# Patient Record
Sex: Female | Born: 1950 | Race: White | Hispanic: No | Marital: Single | Smoking: Never smoker
Health system: Southern US, Community
[De-identification: ages and names within clinical notes are randomized; demographics above are authoritative.]

## PROBLEM LIST (undated history)

## (undated) DIAGNOSIS — R011 Cardiac murmur, unspecified: Secondary | ICD-10-CM

## (undated) DIAGNOSIS — R319 Hematuria, unspecified: Secondary | ICD-10-CM

## (undated) DIAGNOSIS — G4730 Sleep apnea, unspecified: Secondary | ICD-10-CM

## (undated) DIAGNOSIS — M810 Age-related osteoporosis without current pathological fracture: Secondary | ICD-10-CM

## (undated) DIAGNOSIS — E785 Hyperlipidemia, unspecified: Secondary | ICD-10-CM

## (undated) DIAGNOSIS — T7840XA Allergy, unspecified, initial encounter: Secondary | ICD-10-CM

## (undated) DIAGNOSIS — F329 Major depressive disorder, single episode, unspecified: Secondary | ICD-10-CM

## (undated) DIAGNOSIS — F32A Depression, unspecified: Secondary | ICD-10-CM

## (undated) HISTORY — DX: Sleep apnea, unspecified: G47.30

## (undated) HISTORY — DX: Hematuria, unspecified: R31.9

## (undated) HISTORY — DX: Allergy, unspecified, initial encounter: T78.40XA

## (undated) HISTORY — DX: Age-related osteoporosis without current pathological fracture: M81.0

## (undated) HISTORY — DX: Hyperlipidemia, unspecified: E78.5

## (undated) HISTORY — DX: Cardiac murmur, unspecified: R01.1

## (undated) HISTORY — DX: Depression, unspecified: F32.A

## (undated) HISTORY — PX: TONSILLECTOMY: SHX5217

## (undated) HISTORY — DX: Major depressive disorder, single episode, unspecified: F32.9

---

## 2012-12-18 ENCOUNTER — Emergency Department: Payer: Self-pay | Admitting: Emergency Medicine

## 2013-09-08 ENCOUNTER — Ambulatory Visit: Payer: Self-pay | Admitting: Unknown Physician Specialty

## 2013-09-12 LAB — PATHOLOGY REPORT

## 2014-08-28 IMAGING — CR DG ANKLE COMPLETE 3+V*L*
1 series · 5 of 5 positions shown · non-contrast
Comparison: none

REASON FOR EXAM: Pain and swelling after trauma
COMMENTS:   LMP: Post-Menopausal

PROCEDURE:     DXR - DXR ANKLE LEFT COMPLETE  - December 18, 2012  [DATE]
RESULT:     Comparison: None.

[Series 1: x ankle ap left · 0.14mm/px · 5 of 5 slices shown]
[im 1/5]
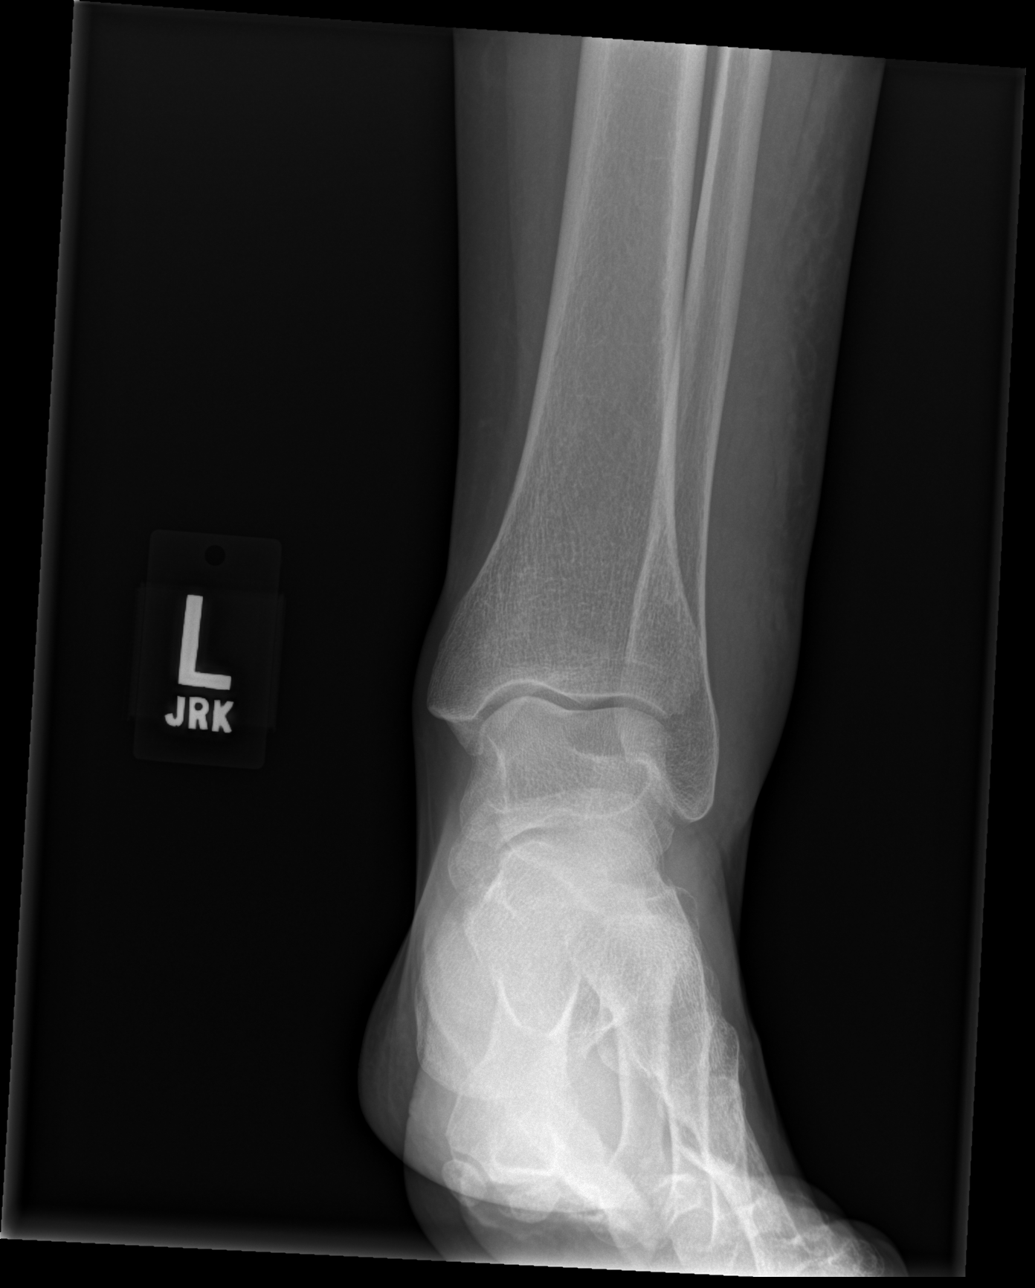
[im 2/5]
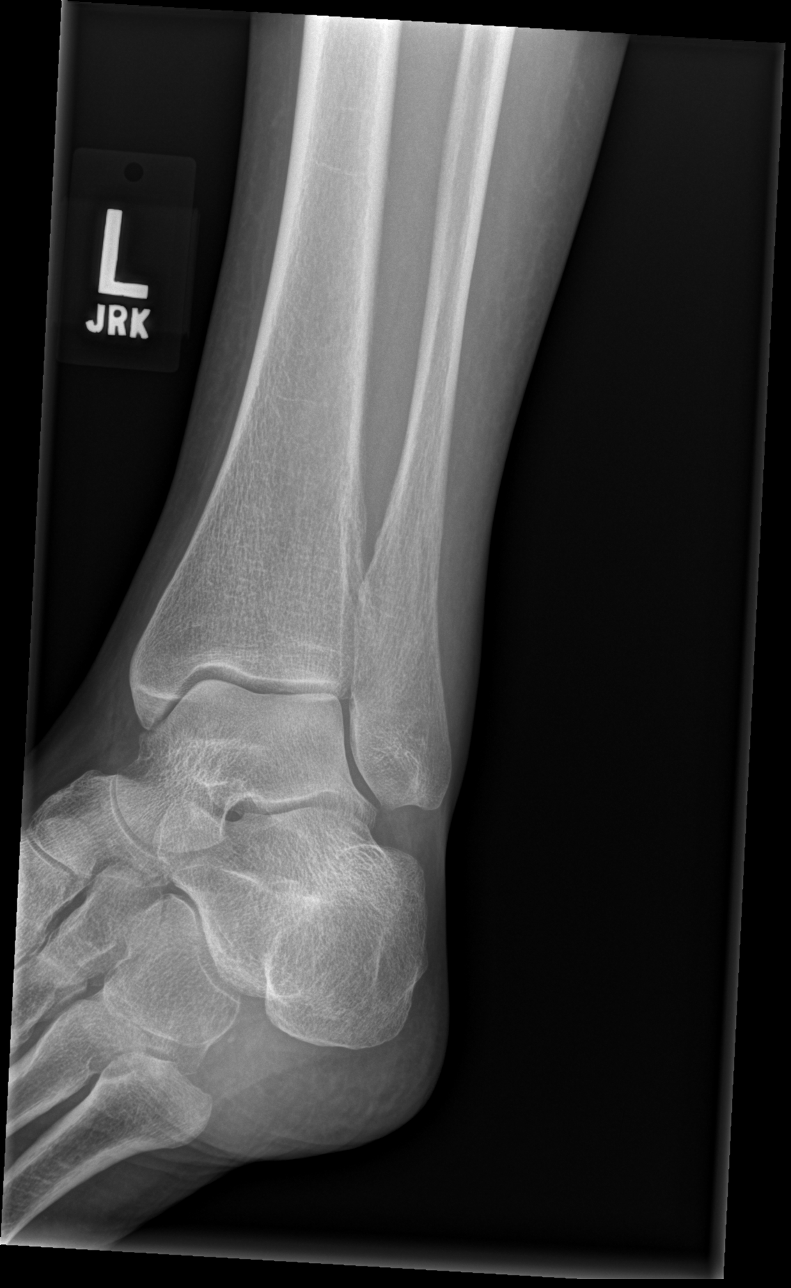
[im 3/5]
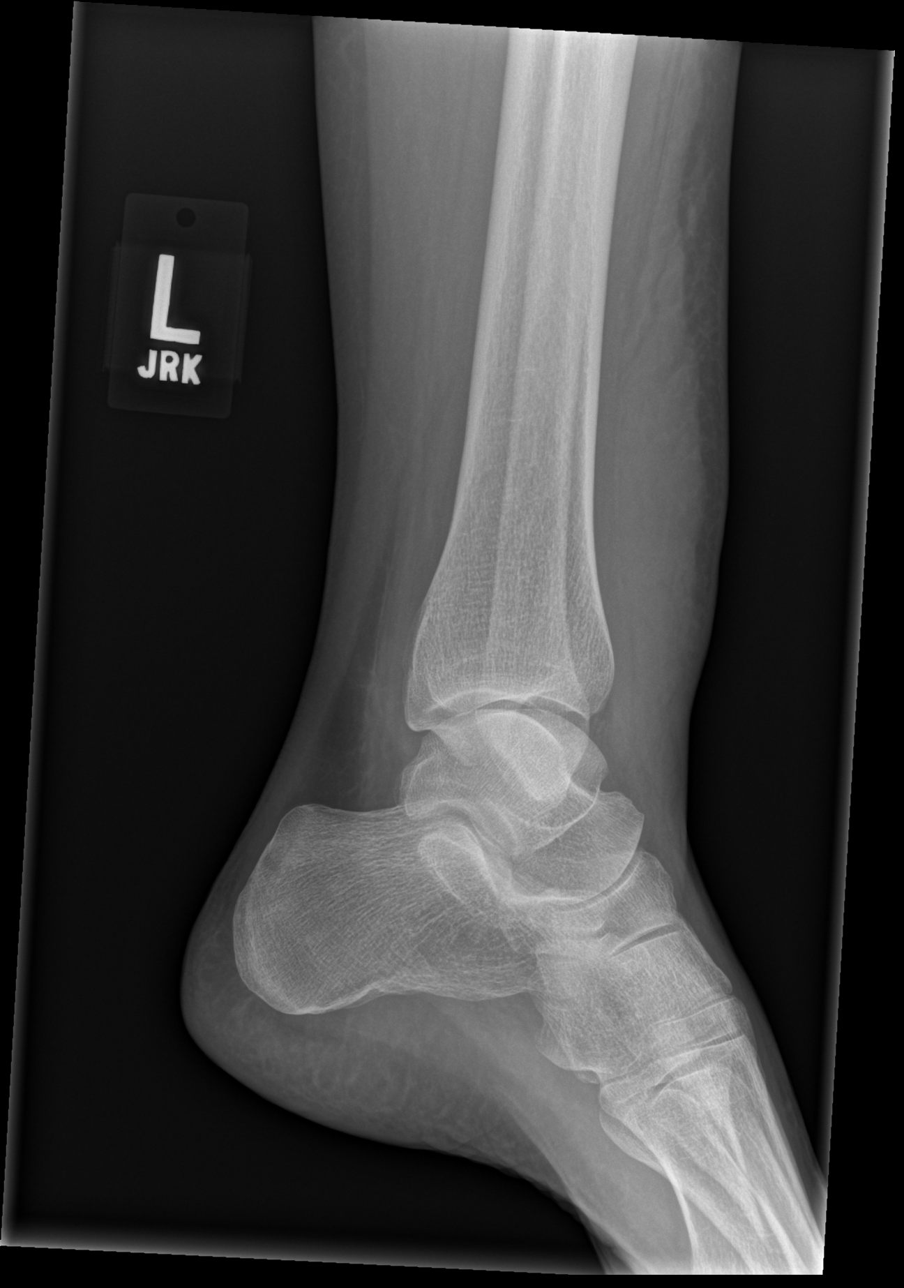
[im 4/5]
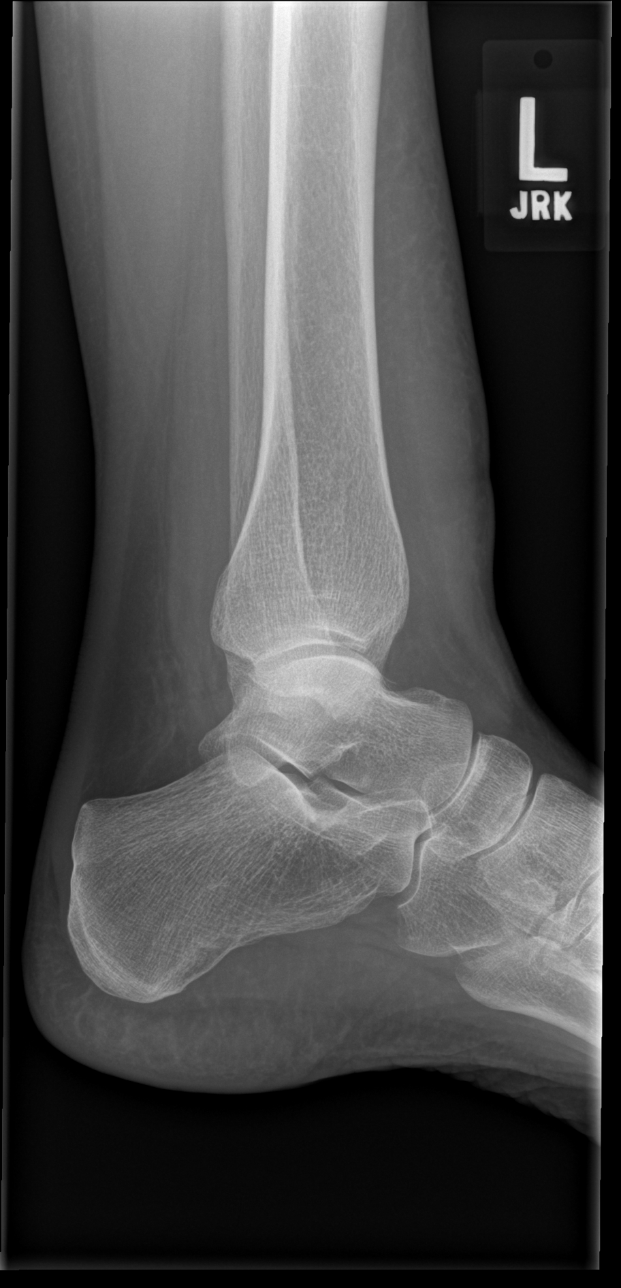
[im 5/5]
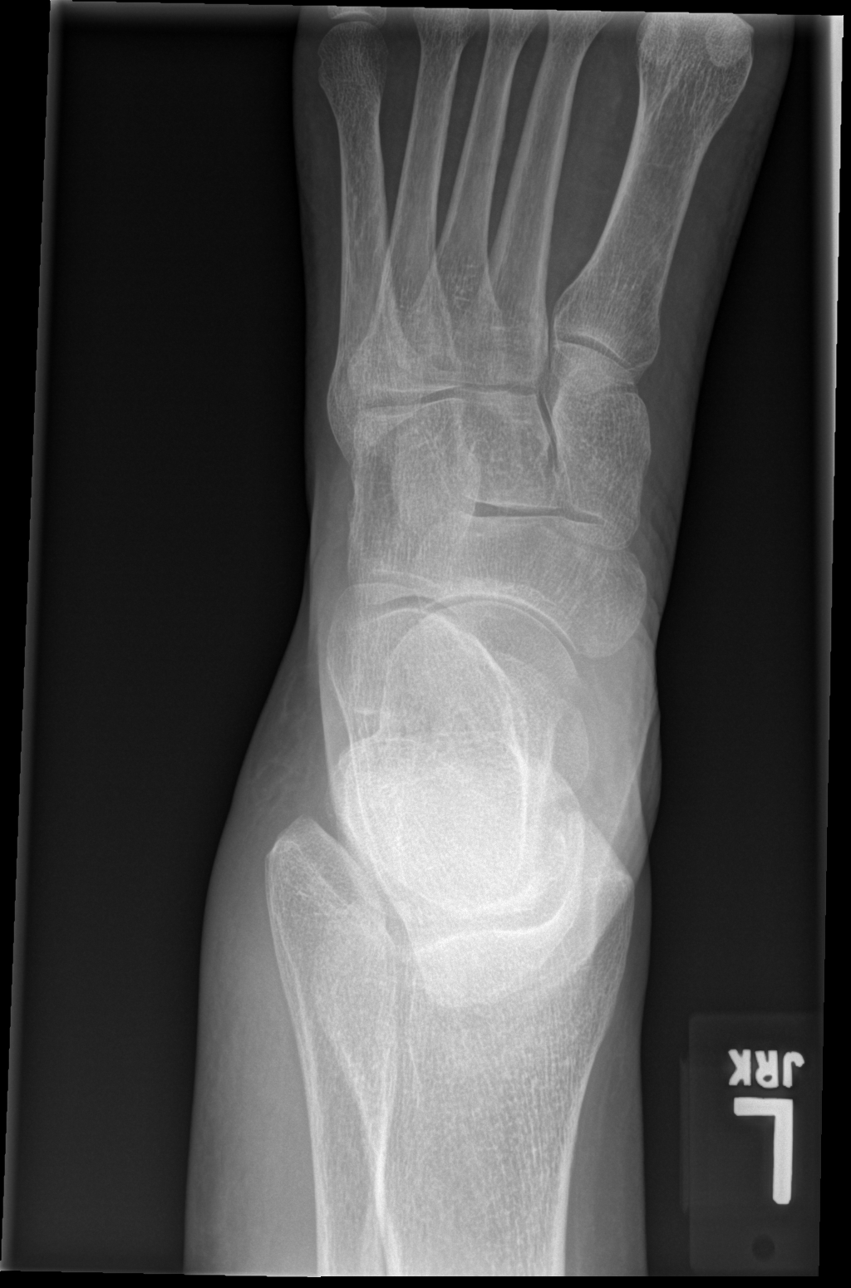

[5 of 5 positions shown; findings below may reference images not displayed]

FINDINGS: No acute fracture. Normal alignment. There is soft tissue swelling about the
lateral malleolus.
IMPRESSION: No acute fracture.

[REDACTED]

## 2015-02-01 ENCOUNTER — Emergency Department: Payer: Self-pay | Admitting: Emergency Medicine

## 2015-06-10 ENCOUNTER — Ambulatory Visit (INDEPENDENT_AMBULATORY_CARE_PROVIDER_SITE_OTHER): Payer: BC Managed Care – PPO | Admitting: Family Medicine

## 2015-06-10 ENCOUNTER — Encounter: Payer: Self-pay | Admitting: Family Medicine

## 2015-06-10 VITALS — BP 127/81 | HR 68 | Temp 98.0°F | Ht 66.8 in | Wt 125.0 lb

## 2015-06-10 DIAGNOSIS — F329 Major depressive disorder, single episode, unspecified: Secondary | ICD-10-CM | POA: Diagnosis not present

## 2015-06-10 DIAGNOSIS — E785 Hyperlipidemia, unspecified: Secondary | ICD-10-CM | POA: Diagnosis not present

## 2015-06-10 DIAGNOSIS — E7800 Pure hypercholesterolemia, unspecified: Secondary | ICD-10-CM

## 2015-06-10 DIAGNOSIS — F32A Depression, unspecified: Secondary | ICD-10-CM | POA: Insufficient documentation

## 2015-06-10 DIAGNOSIS — G4730 Sleep apnea, unspecified: Secondary | ICD-10-CM | POA: Diagnosis not present

## 2015-06-10 LAB — LP+ALT+AST PICCOLO, WAIVED
ALT (SGPT) PICCOLO, WAIVED: 26 U/L (ref 10–47)
AST (SGOT) Piccolo, Waived: 31 U/L (ref 11–38)
Chol/HDL Ratio Piccolo,Waive: 2.2 mg/dL
Cholesterol Piccolo, Waived: 147 mg/dL (ref <200)
HDL Chol Piccolo, Waived: 66 mg/dL (ref >59)
LDL CHOL CALC PICCOLO WAIVED: 65 mg/dL (ref <100)
Triglycerides Piccolo,Waived: 78 mg/dL (ref <150)
VLDL CHOL CALC PICCOLO,WAIVE: 16 mg/dL (ref <30)

## 2015-06-10 MED ORDER — ESCITALOPRAM OXALATE 10 MG PO TABS: 10.0000 mg | ORAL_TABLET | Freq: Every day | ORAL | Status: DC

## 2015-06-10 MED ORDER — ROSUVASTATIN CALCIUM 20 MG PO TABS: 20.0000 mg | ORAL_TABLET | Freq: Every day | ORAL | Status: DC

## 2015-06-10 NOTE — Progress Notes (Signed)
   BP 127/81 mmHg  Pulse 68  Temp(Src) 98 F (36.7 C)  Ht 5' 6.8" (1.697 m)  Wt 125 lb (56.7 kg)  BMI 19.69 kg/m2  SpO2 99%   Subjective:    Patient ID: Darlene Oneill, female    DOB: 09/18/1951, 64 y.o.   MRN: [id number]  HPI: Darlene Oneill is a 64 y.o. female  Chief Complaint  Patient presents with  . Hyperlipidemia  and depression stable no problems with meds takes every day No side effects Taking long term and doing well Also some muscle stiffness in back area Has apt for gyn check today  Relevant past medical, surgical, family and social history reviewed and updated as indicated. Interim medical history since our last visit reviewed. Allergies and medications reviewed and updated.  Review of Systems  Constitutional: Negative.   Respiratory: Negative.   Cardiovascular: Negative.     Per HPI unless specifically indicated above     Objective:    BP 127/81 mmHg  Pulse 68  Temp(Src) 98 F (36.7 C)  Ht 5' 6.8" (1.697 m)  Wt 125 lb (56.7 kg)  BMI 19.69 kg/m2  SpO2 99%  Wt Readings from Last 3 Encounters:  06/10/15 125 lb (56.7 kg)  12/04/14 124 lb (56.246 kg)  12/04/14 124 lb (56.246 kg)    Physical Exam  Constitutional: She is oriented to person, place, and time. She appears well-developed and well-nourished. No distress.  HENT:  Head: Normocephalic and atraumatic.  Right Ear: Hearing normal.  Left Ear: Hearing normal.  Nose: Nose normal.  Eyes: Conjunctivae and lids are normal. Right eye exhibits no discharge. Left eye exhibits no discharge. No scleral icterus.  Cardiovascular: Normal rate, regular rhythm and normal heart sounds.   Pulmonary/Chest: Effort normal and breath sounds normal. No respiratory distress.  Musculoskeletal: Normal range of motion.  Neurological: She is alert and oriented to person, place, and time.  Skin: Skin is intact. No rash noted.  Psychiatric: She has a normal mood and affect. Her speech is normal and behavior is  normal. Judgment and thought content normal. Cognition and memory are normal.        Assessment & Plan:   Problem List Items Addressed This Visit      Other   Depression    The current medical regimen is effective;  continue present plan and medications.       Relevant Medications   escitalopram (LEXAPRO) 10 MG tablet   Sleep apnea    Using CPAP and doing well      Hyperlipidemia    The current medical regimen is effective;  continue present plan and medications.       Relevant Medications   rosuvastatin (CRESTOR) 20 MG tablet    Other Visit Diagnoses    Pure hypercholesterolemia    -  Primary    Relevant Medications    rosuvastatin (CRESTOR) 20 MG tablet    Other Relevant Orders    LP+ALT+AST Piccolo, Waived        Follow up plan: Return in about 6 months (around 12/10/2015) for Physical Exam.

## 2015-06-10 NOTE — Assessment & Plan Note (Signed)
Using CPAP and doing well.  

## 2015-06-10 NOTE — Assessment & Plan Note (Signed)
The current medical regimen is effective;  continue present plan and medications.  

## 2015-08-06 ENCOUNTER — Other Ambulatory Visit: Payer: Self-pay | Admitting: Family Medicine

## 2015-08-14 ENCOUNTER — Other Ambulatory Visit: Payer: Self-pay | Admitting: Family Medicine

## 2015-12-09 ENCOUNTER — Encounter: Payer: Self-pay | Admitting: Family Medicine

## 2015-12-09 ENCOUNTER — Ambulatory Visit (INDEPENDENT_AMBULATORY_CARE_PROVIDER_SITE_OTHER): Payer: BC Managed Care – PPO | Admitting: Family Medicine

## 2015-12-09 VITALS — BP 108/73 | HR 69 | Temp 98.3°F | Ht 67.2 in | Wt 130.0 lb

## 2015-12-09 DIAGNOSIS — Z113 Encounter for screening for infections with a predominantly sexual mode of transmission: Secondary | ICD-10-CM

## 2015-12-09 DIAGNOSIS — Z23 Encounter for immunization: Secondary | ICD-10-CM | POA: Diagnosis not present

## 2015-12-09 DIAGNOSIS — F32A Depression, unspecified: Secondary | ICD-10-CM

## 2015-12-09 DIAGNOSIS — F419 Anxiety disorder, unspecified: Secondary | ICD-10-CM

## 2015-12-09 DIAGNOSIS — F329 Major depressive disorder, single episode, unspecified: Secondary | ICD-10-CM | POA: Diagnosis not present

## 2015-12-09 DIAGNOSIS — Z0000 Encounter for general adult medical examination without abnormal findings: Secondary | ICD-10-CM | POA: Diagnosis not present

## 2015-12-09 LAB — URINALYSIS, ROUTINE W REFLEX MICROSCOPIC
BILIRUBIN UA: NEGATIVE
Glucose, UA: NEGATIVE
Ketones, UA: NEGATIVE
LEUKOCYTES UA: NEGATIVE
Nitrite, UA: NEGATIVE
PH UA: 7 (ref 5.0–7.5)
PROTEIN UA: NEGATIVE
Specific Gravity, UA: 1.015 (ref 1.005–1.030)
UUROB: 0.2 mg/dL (ref 0.2–1.0)

## 2015-12-09 LAB — MICROSCOPIC EXAMINATION: WBC, UA: NONE SEEN /hpf (ref 0–?)

## 2015-12-09 MED ORDER — LORAZEPAM 0.5 MG PO TABS: 0.5000 mg | ORAL_TABLET | Freq: Four times a day (QID) | ORAL | Status: DC | PRN

## 2015-12-09 MED ORDER — ROSUVASTATIN CALCIUM 20 MG PO TABS: 20.0000 mg | ORAL_TABLET | Freq: Every day | ORAL | Status: DC

## 2015-12-09 MED ORDER — ESCITALOPRAM OXALATE 10 MG PO TABS: 10.0000 mg | ORAL_TABLET | Freq: Every day | ORAL | Status: DC

## 2015-12-09 MED ORDER — FLUTICASONE PROPIONATE 50 MCG/ACT NA SUSP: 2.0000 | Freq: Every day | NASAL | Status: AC

## 2015-12-09 NOTE — Progress Notes (Signed)
BP 108/73 mmHg  Pulse 69  Temp(Src) 98.3 F (36.8 C)  Ht 5' 7.2" (1.707 m)  Wt 130 lb (58.968 kg)  BMI 20.24 kg/m2  SpO2 99%   Subjective:    Patient ID: Darlene Oneill, female    DOB: [birth year], 64 y.o.   MRN: [id number]  HPI: Darlene Oneill is a 64 y.o. female  Chief Complaint  Patient presents with  . Annual Exam   patient with air travel coming up and has fear of flying will give some lorazepam to assist  Patient concerned about some weight gain 6 pounds over this last year and her abdomen Doing well with her medications no concerns or issues Relevant past medical, surgical, family and social history reviewed and updated as indicated. Interim medical history since our last visit reviewed. Allergies and medications reviewed and updated.  Review of Systems  Constitutional: Negative.   HENT: Negative.   Eyes: Negative.   Respiratory: Negative.   Cardiovascular: Negative.   Gastrointestinal: Negative.   Endocrine: Negative.   Genitourinary: Negative.   Musculoskeletal: Negative.   Skin: Negative.   Allergic/Immunologic: Negative.   Neurological: Negative.   Hematological: Negative.   Psychiatric/Behavioral: Negative.     Per HPI unless specifically indicated above     Objective:    BP 108/73 mmHg  Pulse 69  Temp(Src) 98.3 F (36.8 C)  Ht 5' 7.2" (1.707 m)  Wt 130 lb (58.968 kg)  BMI 20.24 kg/m2  SpO2 99%  Wt Readings from Last 3 Encounters:  12/09/15 130 lb (58.968 kg)  06/10/15 125 lb (56.7 kg)  12/04/14 124 lb (56.246 kg)    Physical Exam  Constitutional: She is oriented to person, place, and time. She appears well-developed and well-nourished.  HENT:  Head: Normocephalic and atraumatic.  Right Ear: External ear normal.  Left Ear: External ear normal.  Nose: Nose normal.  Mouth/Throat: Oropharynx is clear and moist.  Eyes: Conjunctivae and EOM are normal. Pupils are equal, round, and reactive to light.  Neck: Normal range of motion.  Neck supple. Carotid bruit is not present.  Cardiovascular: Normal rate, regular rhythm and normal heart sounds.   No murmur heard. Pulmonary/Chest: Effort normal and breath sounds normal. She exhibits no mass. Right breast exhibits no mass, no skin change and no tenderness. Left breast exhibits no mass, no skin change and no tenderness. Breasts are symmetrical.  Abdominal: Soft. Bowel sounds are normal. There is no hepatosplenomegaly.  Musculoskeletal: Normal range of motion.  Neurological: She is alert and oriented to person, place, and time.  Skin: No rash noted.  Psychiatric: She has a normal mood and affect. Her behavior is normal. Judgment and thought content normal.    Results for orders placed or performed in visit on 06/10/15  LP+ALT+AST Piccolo, [employer] ElectronicsWaived  Result Value Ref Range   ALT (SGPT) Piccolo, Waived 26 10 - 47 U/L   AST (SGOT) Piccolo, Waived 31 11 - 38 U/L   Cholesterol Piccolo, Waived 147 <200 mg/dL   HDL Chol Piccolo, Waived 66 >59 mg/dL   Triglycerides Piccolo,Waived 78 <150 mg/dL   Chol/HDL Ratio Piccolo,Waive 2.2 mg/dL   LDL Chol Calc Piccolo Waived 65 <100 mg/dL   VLDL Chol Calc Piccolo,Waive 16 <30 mg/dL      Assessment & Plan:   Problem List Items Addressed This Visit      Other   Depression   Relevant Medications   LORazepam (ATIVAN) 0.5 MG tablet   escitalopram (LEXAPRO) 10 MG tablet  Acute anxiety    Airplane travel anxiety      Relevant Medications   LORazepam (ATIVAN) 0.5 MG tablet   escitalopram (LEXAPRO) 10 MG tablet    Other Visit Diagnoses    Immunization due    -  Primary    Relevant Orders    Flu Vaccine QUAD 36+ mos PF IM (Fluarix & Fluzone Quad PF) (Completed)    Routine general medical examination at a health care facility        Relevant Orders    CBC with Differential/Platelet    Comprehensive metabolic panel    Lipid Panel w/o Chol/HDL Ratio    TSH    Urinalysis, Routine w reflex microscopic (not at [facility])    Routine  screening for STI (sexually transmitted infection)        Relevant Orders    Hepatitis C Antibody        Follow up plan: Return in about 6 months (around 06/08/2016) for med check and lipids, alt, ast.

## 2015-12-09 NOTE — Assessment & Plan Note (Signed)
Airplane travel anxiety

## 2015-12-10 ENCOUNTER — Encounter: Payer: Self-pay | Admitting: Family Medicine

## 2015-12-10 LAB — CBC WITH DIFFERENTIAL/PLATELET
BASOS ABS: 0 10*3/uL (ref 0.0–0.2)
Basos: 1 %
EOS (ABSOLUTE): 0.1 10*3/uL (ref 0.0–0.4)
EOS: 4 %
HEMOGLOBIN: 12.6 g/dL (ref 11.1–15.9)
Hematocrit: 37.6 % (ref 34.0–46.6)
IMMATURE GRANS (ABS): 0 10*3/uL (ref 0.0–0.1)
IMMATURE GRANULOCYTES: 0 %
LYMPHS: 38 %
Lymphocytes Absolute: 1.5 10*3/uL (ref 0.7–3.1)
MCH: 28 pg (ref 26.6–33.0)
MCHC: 33.5 g/dL (ref 31.5–35.7)
MCV: 84 fL (ref 79–97)
MONOCYTES: 10 %
Monocytes Absolute: 0.4 10*3/uL (ref 0.1–0.9)
Neutrophils Absolute: 1.8 10*3/uL (ref 1.4–7.0)
Neutrophils: 47 %
Platelets: 188 10*3/uL (ref 150–379)
RBC: 4.5 x10E6/uL (ref 3.77–5.28)
RDW: 13.7 % (ref 12.3–15.4)
WBC: 3.9 10*3/uL (ref 3.4–10.8)

## 2015-12-10 LAB — COMPREHENSIVE METABOLIC PANEL
ALBUMIN: 4.1 g/dL (ref 3.6–4.8)
ALK PHOS: 37 IU/L — AB (ref 39–117)
ALT: 22 IU/L (ref 0–32)
AST: 18 IU/L (ref 0–40)
Albumin/Globulin Ratio: 2 (ref 1.1–2.5)
BUN / CREAT RATIO: 26 (ref 11–26)
BUN: 22 mg/dL (ref 8–27)
Bilirubin Total: 0.4 mg/dL (ref 0.0–1.2)
CALCIUM: 9.3 mg/dL (ref 8.7–10.3)
CO2: 25 mmol/L (ref 18–29)
CREATININE: 0.85 mg/dL (ref 0.57–1.00)
Chloride: 104 mmol/L (ref 96–106)
GFR calc Af Amer: 85 mL/min/{1.73_m2} (ref >59)
GFR, EST NON AFRICAN AMERICAN: 74 mL/min/{1.73_m2} (ref >59)
GLUCOSE: 89 mg/dL (ref 65–99)
Globulin, Total: 2.1 g/dL (ref 1.5–4.5)
Potassium: 4.3 mmol/L (ref 3.5–5.2)
Sodium: 142 mmol/L (ref 134–144)
TOTAL PROTEIN: 6.2 g/dL (ref 6.0–8.5)

## 2015-12-10 LAB — TSH: TSH: 2.81 u[IU]/mL (ref 0.450–4.500)

## 2015-12-10 LAB — LIPID PANEL W/O CHOL/HDL RATIO
CHOLESTEROL TOTAL: 142 mg/dL (ref 100–199)
HDL: 67 mg/dL (ref >39)
LDL CALC: 64 mg/dL (ref 0–99)
Triglycerides: 55 mg/dL (ref 0–149)
VLDL CHOLESTEROL CAL: 11 mg/dL (ref 5–40)

## 2015-12-10 LAB — HEPATITIS C ANTIBODY

## 2015-12-11 ENCOUNTER — Other Ambulatory Visit: Payer: Self-pay | Admitting: Family Medicine

## 2016-06-08 ENCOUNTER — Encounter: Payer: Self-pay | Admitting: Family Medicine

## 2016-06-08 ENCOUNTER — Other Ambulatory Visit: Payer: Self-pay

## 2016-06-08 ENCOUNTER — Ambulatory Visit (INDEPENDENT_AMBULATORY_CARE_PROVIDER_SITE_OTHER): Payer: BC Managed Care – PPO | Admitting: Family Medicine

## 2016-06-08 VITALS — BP 131/75 | HR 71 | Temp 97.9°F | Ht 67.0 in | Wt 127.0 lb

## 2016-06-08 DIAGNOSIS — G4730 Sleep apnea, unspecified: Secondary | ICD-10-CM

## 2016-06-08 DIAGNOSIS — E785 Hyperlipidemia, unspecified: Secondary | ICD-10-CM

## 2016-06-08 DIAGNOSIS — Z124 Encounter for screening for malignant neoplasm of cervix: Secondary | ICD-10-CM

## 2016-06-08 DIAGNOSIS — F329 Major depressive disorder, single episode, unspecified: Secondary | ICD-10-CM

## 2016-06-08 DIAGNOSIS — F32A Depression, unspecified: Secondary | ICD-10-CM

## 2016-06-08 LAB — LP+ALT+AST PICCOLO, WAIVED
ALT (SGPT) PICCOLO, WAIVED: 28 U/L (ref 10–47)
AST (SGOT) Piccolo, Waived: 31 U/L (ref 11–38)
Chol/HDL Ratio Piccolo,Waive: 2.2 mg/dL
Cholesterol Piccolo, Waived: 151 mg/dL (ref <200)
HDL CHOL PICCOLO, WAIVED: 68 mg/dL (ref >59)
LDL Chol Calc Piccolo Waived: 64 mg/dL (ref <100)
TRIGLYCERIDES PICCOLO,WAIVED: 92 mg/dL (ref <150)
VLDL CHOL CALC PICCOLO,WAIVE: 18 mg/dL (ref <30)

## 2016-06-08 MED ORDER — ESCITALOPRAM OXALATE 10 MG PO TABS: 10.0000 mg | ORAL_TABLET | Freq: Every day | ORAL | Status: DC

## 2016-06-08 MED ORDER — ROSUVASTATIN CALCIUM 20 MG PO TABS: 20.0000 mg | ORAL_TABLET | Freq: Every day | ORAL | Status: DC

## 2016-06-08 NOTE — Addendum Note (Signed)
Addended by: Lurlean HornsWILSON, Kaitlynne Wenz H on: 06/08/2016 09:37 AM   Modules accepted: Orders, SmartSet

## 2016-06-08 NOTE — Assessment & Plan Note (Signed)
The current medical regimen is effective;  continue present plan and medications.  

## 2016-06-08 NOTE — Progress Notes (Signed)
BP 131/75 mmHg  Pulse 71  Temp(Src) 97.9 F (36.6 C)  Ht  (1.702 m)  Wt 127 lb (57.607 kg)  BMI 19.89 kg/m2  SpO2 98%   Subjective:    Patient ID: Darlene Oneill, female    DOB: 22-Aug-1951, 65 y.o.   MRN: [id number]  HPI: Darlene Oneill is a 65 y.o. female  Chief Complaint  Patient presents with  . Hyperlipidemia  . Depression  . Patient in need of PAP    scheduled for next week, but she is moving out of town  Patient doing well for cholesterol no issues with medications taken faithfully without problems Nerves doing well with Lexapro low-dose. Specially with stress of impending move. Patient also scheduled GYN or Pap smear next week but is moving this week so will go ahead and do Pap smear patient had physical exam 6 months ago.  Relevant past medical, surgical, family and social history reviewed and updated as indicated. Interim medical history since our last visit reviewed. Allergies and medications reviewed and updated.  Review of Systems  Constitutional: Negative.   Respiratory: Negative.   Cardiovascular: Negative.     Per HPI unless specifically indicated above     Objective:    BP 131/75 mmHg  Pulse 71  Temp(Src) 97.9 F (36.6 C)  Ht  (1.702 m)  Wt 127 lb (57.607 kg)  BMI 19.89 kg/m2  SpO2 98%  Wt Readings from Last 3 Encounters:  06/08/16 127 lb (57.607 kg)  12/09/15 130 lb (58.968 kg)  06/10/15 125 lb (56.7 kg)    Physical Exam  Constitutional: She is oriented to person, place, and time. She appears well-developed and well-nourished. No distress.  HENT:  Head: Normocephalic and atraumatic.  Right Ear: Hearing normal.  Left Ear: Hearing normal.  Nose: Nose normal.  Eyes: Conjunctivae and lids are normal. Right eye exhibits no discharge. Left eye exhibits no discharge. No scleral icterus.  Pulmonary/Chest: Effort normal. No respiratory distress.  Genitourinary: Uterus normal. Cervix exhibits no motion tenderness. Right adnexum  displays no mass, no tenderness and no fullness. Left adnexum displays no mass, no tenderness and no fullness. No erythema or tenderness in the vagina. No vaginal discharge found.  Musculoskeletal: Normal range of motion.  Neurological: She is alert and oriented to person, place, and time.  Skin: Skin is intact. No rash noted.  Psychiatric: She has a normal mood and affect. Her speech is normal and behavior is normal. Judgment and thought content normal. Cognition and memory are normal.    Results for orders placed or performed in visit on 12/09/15  Microscopic Examination  Result Value Ref Range   WBC, UA None seen 0 -  5 /hpf   RBC, UA 3-10 (A) 0 -  2 /hpf   Epithelial Cells (non renal) 0-10 0 - 10 /hpf   Mucus, UA Present Not Estab.   Bacteria, UA Few None seen/Few  Hepatitis C Antibody  Result Value Ref Range   Hep C Virus Ab <0.1 0.0 - 0.9 s/co ratio  CBC with Differential/Platelet  Result Value Ref Range   WBC 3.9 3.4 - 10.8 x10E3/uL   RBC 4.50 3.77 - 5.28 x10E6/uL   Hemoglobin 12.6 11.1 - 15.9 g/dL   Hematocrit 21.3 08.6 - 46.6 %   MCV 84 79 - 97 fL   MCH 28.0 26.6 - 33.0 pg   MCHC 33.5 31.5 - 35.7 g/dL   RDW 57.8 46.9 - 62.9 %   Platelets  188 150 - 379 x10E3/uL   Neutrophils 47 %   Lymphs 38 %   Monocytes 10 %   Eos 4 %   Basos 1 %   Neutrophils Absolute 1.8 1.4 - 7.0 x10E3/uL   Lymphocytes Absolute 1.5 0.7 - 3.1 x10E3/uL   Monocytes Absolute 0.4 0.1 - 0.9 x10E3/uL   EOS (ABSOLUTE) 0.1 0.0 - 0.4 x10E3/uL   Basophils Absolute 0.0 0.0 - 0.2 x10E3/uL   Immature Granulocytes 0 %   Immature Grans (Abs) 0.0 0.0 - 0.1 x10E3/uL  Comprehensive metabolic panel  Result Value Ref Range   Glucose 89 65 - 99 mg/dL   BUN 22 8 - 27 mg/dL   Creatinine, Ser 1.610.85 0.57 - 1.00 mg/dL   GFR calc non Af Amer 74 >59 mL/min/1.73   GFR calc Af Amer 85 >59 mL/min/1.73   BUN/Creatinine Ratio 26 11 - 26   Sodium 142 134 - 144 mmol/L   Potassium 4.3 3.5 - 5.2 mmol/L   Chloride 104 96 -  106 mmol/L   CO2 25 18 - 29 mmol/L   Calcium 9.3 8.7 - 10.3 mg/dL   Total Protein 6.2 6.0 - 8.5 g/dL   Albumin 4.1 3.6 - 4.8 g/dL   Globulin, Total 2.1 1.5 - 4.5 g/dL   Albumin/Globulin Ratio 2.0 1.1 - 2.5   Bilirubin Total 0.4 0.0 - 1.2 mg/dL   Alkaline Phosphatase 37 (L) 39 - 117 IU/L   AST 18 0 - 40 IU/L   ALT 22 0 - 32 IU/L  Lipid Panel w/o Chol/HDL Ratio  Result Value Ref Range   Cholesterol, Total 142 100 - 199 mg/dL   Triglycerides 55 0 - 149 mg/dL   HDL 67 >09>39 mg/dL   VLDL Cholesterol Cal 11 5 - 40 mg/dL   LDL Calculated 64 0 - 99 mg/dL  TSH  Result Value Ref Range   TSH 2.810 0.450 - 4.500 uIU/mL  Urinalysis, Routine w reflex microscopic ([facility])  Result Value Ref Range   Specific Gravity, UA 1.015 1.005 - 1.030   pH, UA 7.0 5.0 - 7.5   Color, UA Yellow Yellow   Appearance Ur Clear Clear   Leukocytes, UA Negative Negative   Protein, UA Negative Negative/Trace   Glucose, UA Negative Negative   Ketones, UA Negative Negative   RBC, UA 3+ (A) Negative   Bilirubin, UA Negative Negative   Urobilinogen, Ur 0.2 0.2 - 1.0 mg/dL   Nitrite, UA Negative Negative   Microscopic Examination See below:       Assessment & Plan:   Problem List Items Addressed This Visit      Other   Depression    The current medical regimen is effective;  continue present plan and medications.       Relevant Medications   escitalopram (LEXAPRO) 10 MG tablet   Sleep apnea    The current medical regimen is effective;  continue present plan and medications.       Hyperlipidemia - Primary    The current medical regimen is effective;  continue present plan and medications.       Relevant Medications   rosuvastatin (CRESTOR) 20 MG tablet   Other Relevant Orders   LP+ALT+AST Piccolo, Waived     Pap smear done will await results  Follow up plan: Return for no  f/u as moving.

## 2016-06-08 NOTE — Addendum Note (Signed)
Addended by: Lurlean HornsWILSON, Marcelle Hepner H on: 06/08/2016 09:46 AM   Modules accepted: Kipp BroodSmartSet

## 2016-06-10 ENCOUNTER — Encounter: Payer: Self-pay | Admitting: Family Medicine

## 2016-06-10 LAB — IGP, APTIMA HPV, RFX 16/18,45
HPV Aptima: NEGATIVE
PAP Smear Comment: 0

## 2016-06-26 ENCOUNTER — Other Ambulatory Visit: Payer: Self-pay | Admitting: Family Medicine

## 2016-12-10 ENCOUNTER — Encounter: Payer: BC Managed Care – PPO | Admitting: Family Medicine

## 2017-03-06 ENCOUNTER — Other Ambulatory Visit: Payer: Self-pay | Admitting: Family Medicine

## 2017-03-24 ENCOUNTER — Other Ambulatory Visit: Payer: Self-pay | Admitting: Family Medicine

## 2017-03-24 NOTE — Telephone Encounter (Signed)
  Last routine OV: 06/08/16 Next OV: None on file.

## 2017-06-05 ENCOUNTER — Other Ambulatory Visit: Payer: Self-pay | Admitting: Family Medicine

## 2017-06-06 NOTE — Telephone Encounter (Signed)
apt 

## 2017-08-24 ENCOUNTER — Other Ambulatory Visit: Payer: Self-pay | Admitting: Family Medicine

## 2017-08-24 NOTE — Telephone Encounter (Signed)
Needs appointment

## 2017-08-24 NOTE — Telephone Encounter (Signed)
Routing to provider. No follow up on file. 

## 2017-08-26 NOTE — Telephone Encounter (Signed)
Spoke with patient, refill request sent to us in ERROR.

## 2021-11-20 DEATH — deceased
